# Patient Record
Sex: Male | Born: 1960
Health system: Southern US, Community
[De-identification: ages and names within clinical notes are randomized; demographics above are authoritative.]

## PROBLEM LIST (undated history)

## (undated) HISTORY — PX: FINGER SURGERY: SHX640

---

## 1998-05-10 ENCOUNTER — Emergency Department (HOSPITAL_COMMUNITY): Admission: EM | Admit: 1998-05-10 | Discharge: 1998-05-10 | Payer: Self-pay | Admitting: Internal Medicine

## 2008-04-23 ENCOUNTER — Emergency Department (HOSPITAL_COMMUNITY): Admission: EM | Admit: 2008-04-23 | Discharge: 2008-04-23 | Payer: Self-pay | Admitting: Emergency Medicine

## 2009-07-17 IMAGING — CR DG CERVICAL SPINE COMPLETE 4+V
6 series · 6 of 6 positions shown · non-contrast
Comparison: None

CLINICAL DATA: Trauma

CERVICAL SPINE - COMPLETE 4+ VIEW

[w c-spine lat *]
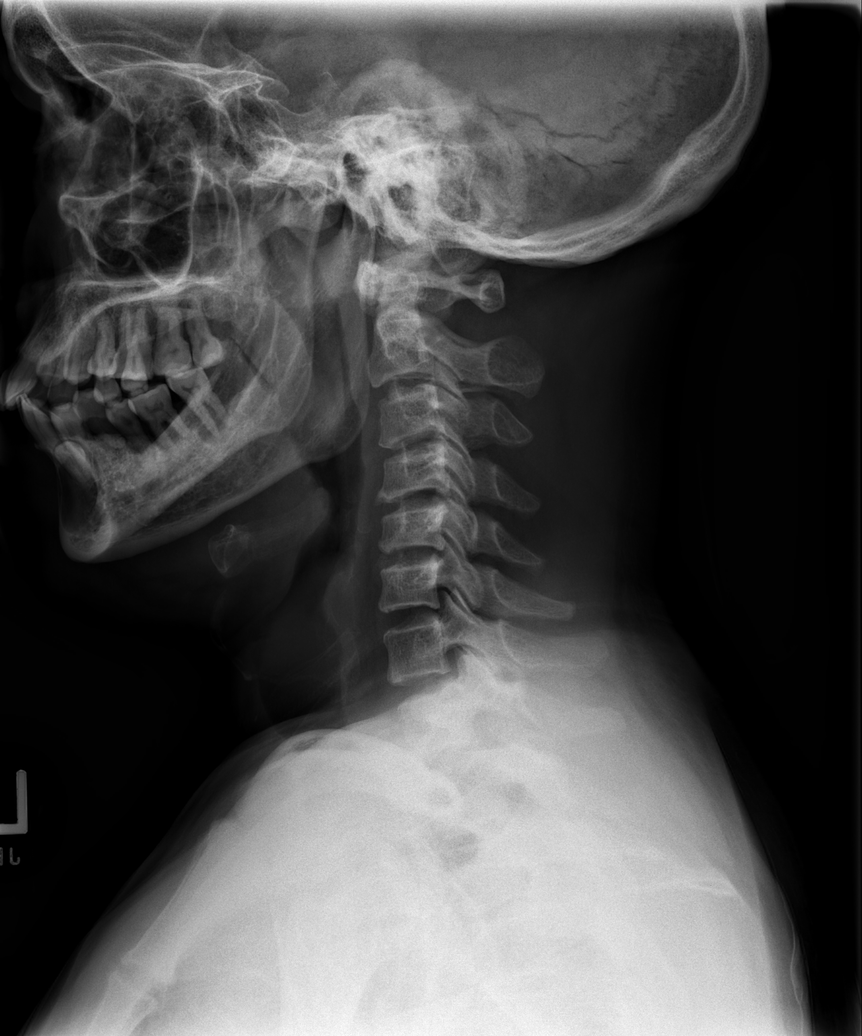

[w c-spine oblique]
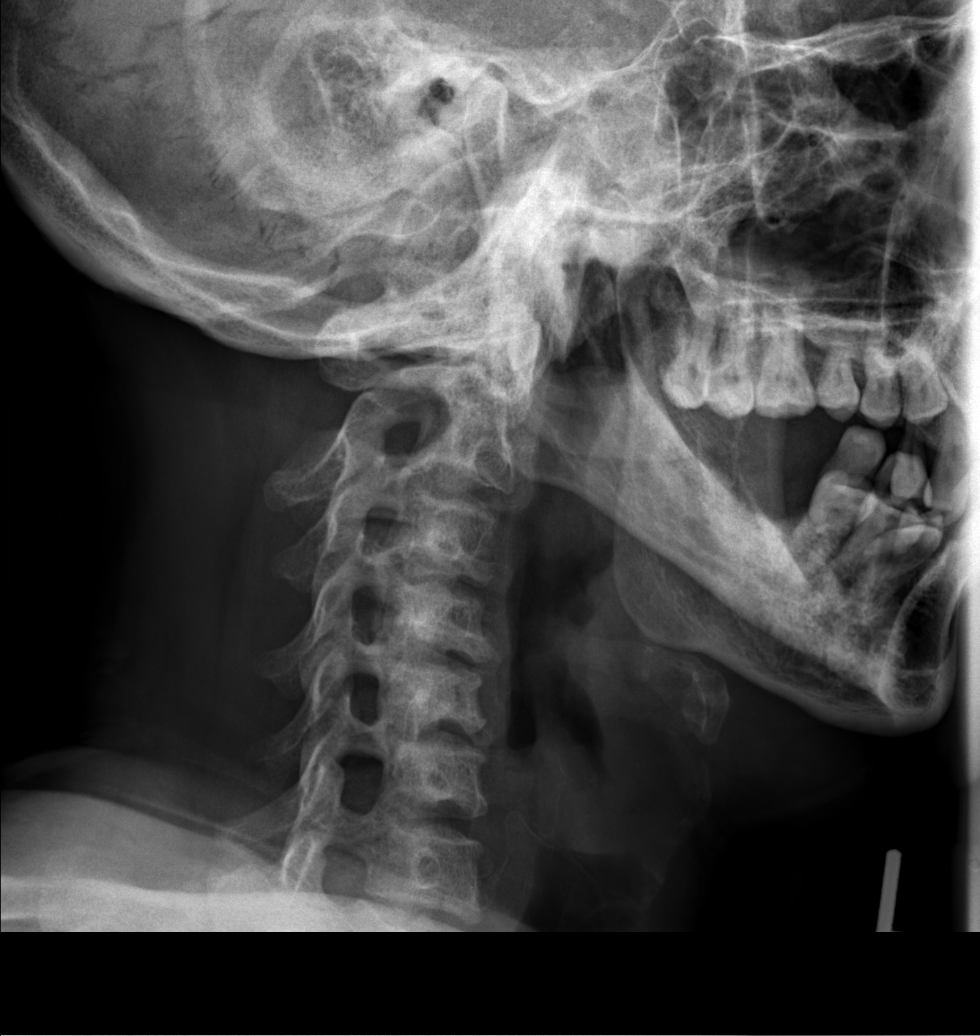

[w c-spine oblique *]
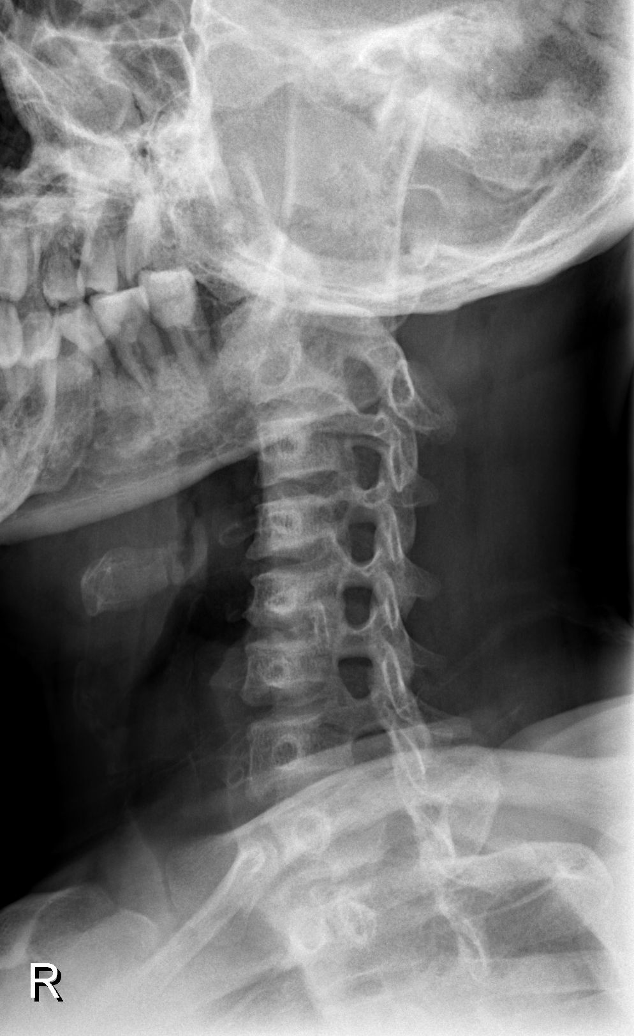

[w c-spine a.p.]
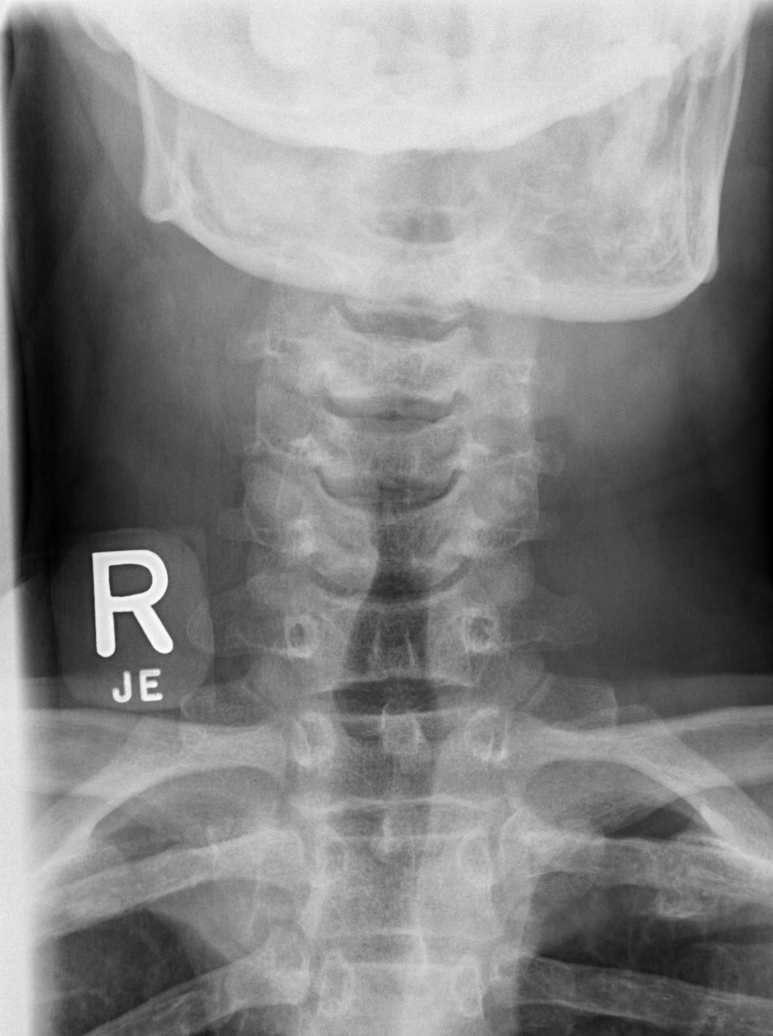

[w c-spine odontoid]
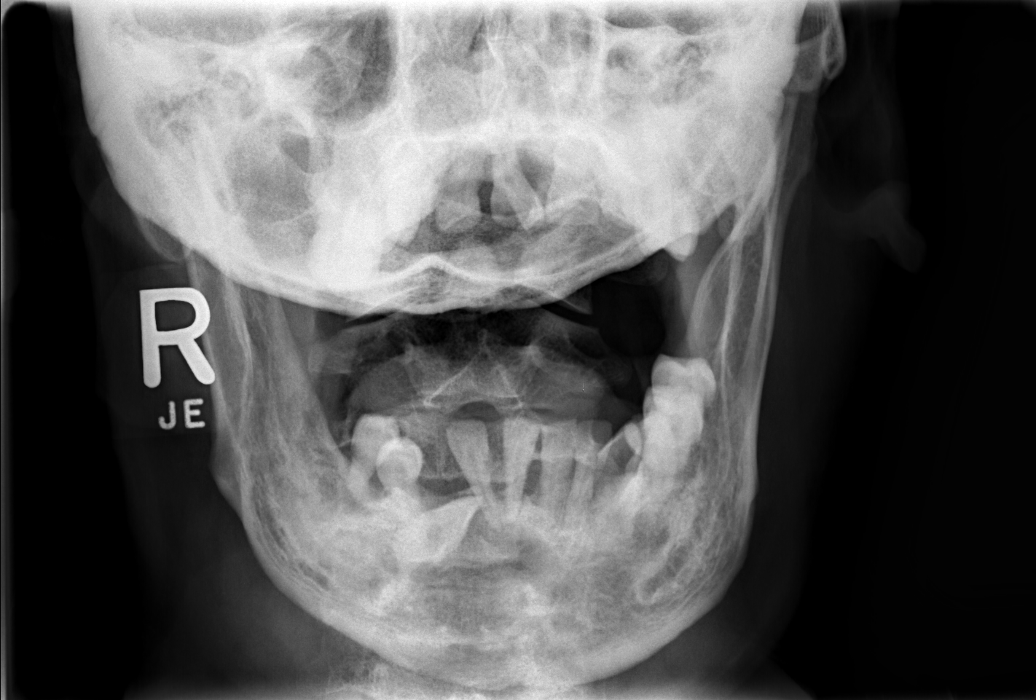

[w c-spine odontoid *]
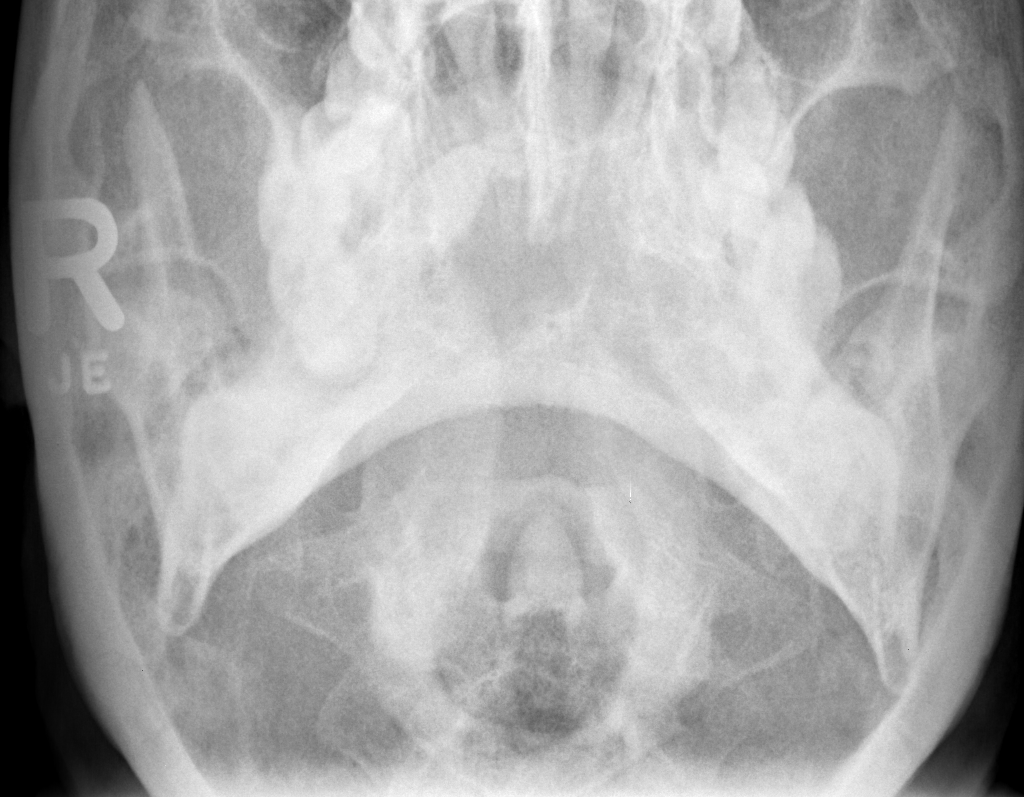

[6 of 6 positions shown; findings below may reference images not displayed]

FINDINGS: The lateral view images through the top of T1.
Prevertebral soft tissues are normal.  There is mild straightening
and reversal of the expected cervical lordosis centered about the
C5-C6 level.  Mild but age advanced spondylosis including at C4-C5
and C5-C6.  Apparent neural foraminal narrowing at C3-C4 on the
right.  Facets are well-aligned.  Odontoid process intact.  The
lateral masses partially obscured on the first open mouth view.
IMPRESSION: 1.  Minimally limited evaluation of the lateral masses.
2.  Otherwise no evidence of fracture subluxation.
3. Straightening of expected cervical lordosis could be positional,
due to muscular spasm, or ligamentous injury.
4.  Mildly age advanced spondylosis.

## 2009-09-15 ENCOUNTER — Emergency Department (HOSPITAL_COMMUNITY): Admission: EM | Admit: 2009-09-15 | Discharge: 2009-09-15 | Payer: Self-pay | Admitting: Family Medicine

## 2009-12-29 ENCOUNTER — Emergency Department (HOSPITAL_COMMUNITY): Admission: EM | Admit: 2009-12-29 | Discharge: 2009-12-29 | Payer: Self-pay | Admitting: Family Medicine

## 2011-01-26 LAB — CBC
HCT: 44.3 % (ref 39.0–52.0)
Hemoglobin: 14.7 g/dL (ref 13.0–17.0)
RBC: 5.51 MIL/uL (ref 4.22–5.81)
WBC: 8.3 10*3/uL (ref 4.0–10.5)

## 2011-01-26 LAB — DIFFERENTIAL
Eosinophils Relative: 5 % (ref 0–5)
Lymphocytes Relative: 28 % (ref 12–46)
Lymphs Abs: 2.3 10*3/uL (ref 0.7–4.0)
Monocytes Absolute: 0.7 10*3/uL (ref 0.1–1.0)
Monocytes Relative: 8 % (ref 3–12)

## 2012-11-14 ENCOUNTER — Encounter (HOSPITAL_COMMUNITY): Payer: Self-pay | Admitting: *Deleted

## 2012-11-14 ENCOUNTER — Emergency Department (HOSPITAL_COMMUNITY)
Admission: EM | Admit: 2012-11-14 | Discharge: 2012-11-14 | Disposition: A | Discharge status: Home / Self Care | Payer: BC Managed Care – PPO | Source: Home / Self Care | Attending: Emergency Medicine | Admitting: Emergency Medicine

## 2012-11-14 DIAGNOSIS — K1120 Sialoadenitis, unspecified: Secondary | ICD-10-CM

## 2012-11-14 DIAGNOSIS — K1122 Acute recurrent sialoadenitis: Secondary | ICD-10-CM

## 2012-11-14 MED ORDER — HYDROCODONE-ACETAMINOPHEN 5-325 MG PO TABS: 2.0000 | ORAL_TABLET | ORAL | Status: DC | PRN

## 2012-11-14 MED ORDER — PENICILLIN V POTASSIUM 500 MG PO TABS: 500.0000 mg | ORAL_TABLET | Freq: Four times a day (QID) | ORAL | Status: AC

## 2012-11-14 NOTE — ED Provider Notes (Addendum)
History     CSN: [id number]  Arrival date & time 11/14/12  1217   First MD Initiated Contact with Patient 11/14/12 1252      Chief Complaint  Patient presents with  . Facial Swelling    (Consider location/radiation/quality/duration/timing/severity/associated sxs/prior treatment) HPI Comments: Patient presents urgent care complaining of sudden onset of left facial swelling within the last 2 days. This morning was somewhat tender at touch and a bit more swollen.( Patient points to preauricular region/parotideal region). Patient goes into describing that he has had similar episodes before even on the right side of his face. In the past he has been given substance our to take or drink and has never taken antibiotics for it before. He feels this particular occasion the swelling is tender which is somewhat different than other previous dispense. He relates that in the 70s he had several of similar episodes. Patient denies any constitutional symptoms such as an additional weight loss, fevers, changes in appetite. Patient is a nonsmoker or does not chew tobacco. Patient denies a sore throat, difficulty swallowing, impaired hearing, or facial rashes. Denies any facial trauma or injury  Patient is a 52 y.o. male presenting with ear pain. The history is provided by the patient.  Otalgia This is a new problem. The current episode started 2 days ago. There is pain in the left ear. The problem has been gradually worsening. There has been no fever. The pain is at a severity of 5/10. The pain is mild. Pertinent negatives include no ear discharge, no headaches, no hearing loss, no rhinorrhea, no sore throat, no abdominal pain, no diarrhea, no neck pain and no rash. His past medical history does not include chronic ear infection or hearing loss.    History reviewed. No pertinent past medical history.  History reviewed. No pertinent past surgical history.  No family history on file.  History  Substance Use  Topics  . Smoking status: Never Smoker   . Smokeless tobacco: Not on file  . Alcohol Use: No      Review of Systems  Constitutional: Negative for fever, chills, diaphoresis, activity change and fatigue.  HENT: Positive for ear pain. Negative for hearing loss, sore throat, rhinorrhea, drooling, neck pain, neck stiffness, sinus pressure and ear discharge.   Eyes: Negative for discharge and itching.  Gastrointestinal: Negative for abdominal pain and diarrhea.  Skin: Negative for rash and wound.  Neurological: Positive for facial asymmetry. Negative for dizziness, tremors, seizures, weakness, light-headedness and headaches.    Allergies  Review of patient's allergies indicates no known allergies.  Home Medications   Current Outpatient Rx  Name  Route  Sig  Dispense  Refill  . HYDROCODONE-ACETAMINOPHEN 5-325 MG PO TABS   Oral   Take 2 tablets by mouth every 4 (four) hours as needed for pain.   10 tablet   0   . PENICILLIN V POTASSIUM 500 MG PO TABS   Oral   Take 1 tablet (500 mg total) by mouth 4 (four) times daily.   40 tablet   0     BP 124/80  Pulse 72  Temp 98.6 F (37 C) (Oral)  SpO2 100%  Physical Exam  Nursing note and vitals reviewed. Constitutional: He is oriented to person, place, and time. Vital signs are normal. He appears well-developed and well-nourished.  Non-toxic appearance. He does not have a sickly appearance. He does not appear ill. No distress.  HENT:  Head:    Right Ear: Hearing, tympanic membrane, external  ear and ear canal normal. No drainage or swelling.  Left Ear: Hearing, tympanic membrane, external ear and ear canal normal. No drainage or swelling.  Mouth/Throat: No oropharyngeal exudate.  Eyes: Conjunctivae normal are normal.  Neck: Neck supple. No JVD present. No tracheal deviation present. No thyromegaly present.  Cardiovascular: Normal rate.  Exam reveals no gallop and no friction rub.   No murmur heard. Pulmonary/Chest: Effort  normal and breath sounds normal.  Lymphadenopathy:    He has no cervical adenopathy.  Neurological: He is alert and oriented to person, place, and time. No cranial nerve deficit. He exhibits normal muscle tone. Coordination normal.  Skin: Skin is warm. No rash noted. No erythema.    ED Course  Procedures (including critical care time)  Labs Reviewed - No data to display No results found.   1. Acute recurrent sialoadenitis       MDM  Parotiditis versus sialoadenitis.-Anatomically affected area seems to be more consistent with his parotid gland. Have started patient on a course of penicillin given him something for pain and discomfort encouraged him to followup with an ENT doctor because of his recurrent episodes. And also discussed what symptoms should warrant his return, or to pursue further evaluation and treatment in the emergency department such as increased swelling tenderness or any fevers. Patient understood, and was agreeable to treatment plan and followup care as specified.        Jimmie Molly, MD 11/14/12 1354  Jimmie Molly, MD 11/14/12 1355

## 2012-11-14 NOTE — ED Notes (Signed)
Pt  Reports  Swelling  l  Side  Face  Near  The ear          The  Area  Is  Tender  To  Touch  He  denys  Any  sorethroat  Or  Any  Other  Symptoms         He  Reports  The  Symptoms  Started  yest  He  Reports  Had  Swelling  r side   Ear  In the  Distant  Past  But  It is  Not swollen    Now    He states

## 2014-02-28 ENCOUNTER — Emergency Department (HOSPITAL_COMMUNITY)
Admission: EM | Admit: 2014-02-28 | Discharge: 2014-02-28 | Disposition: A | Discharge status: Home / Self Care | Payer: BC Managed Care – PPO | Attending: Emergency Medicine | Admitting: Emergency Medicine

## 2014-02-28 ENCOUNTER — Encounter (HOSPITAL_COMMUNITY): Payer: Self-pay | Admitting: Emergency Medicine

## 2014-02-28 ENCOUNTER — Emergency Department (HOSPITAL_COMMUNITY): Payer: BC Managed Care – PPO

## 2014-02-28 ENCOUNTER — Emergency Department (INDEPENDENT_AMBULATORY_CARE_PROVIDER_SITE_OTHER)
Admission: EM | Admit: 2014-02-28 | Discharge: 2014-02-28 | Disposition: A | Discharge status: Nursing Home (Includes ICF) | Payer: BC Managed Care – PPO | Source: Home / Self Care | Attending: Family Medicine | Admitting: Family Medicine

## 2014-02-28 ENCOUNTER — Other Ambulatory Visit (HOSPITAL_COMMUNITY): Payer: BC Managed Care – PPO

## 2014-02-28 DIAGNOSIS — R1032 Left lower quadrant pain: Secondary | ICD-10-CM

## 2014-02-28 DIAGNOSIS — K529 Noninfective gastroenteritis and colitis, unspecified: Secondary | ICD-10-CM

## 2014-02-28 DIAGNOSIS — R52 Pain, unspecified: Secondary | ICD-10-CM

## 2014-02-28 DIAGNOSIS — K6389 Other specified diseases of intestine: Secondary | ICD-10-CM | POA: Insufficient documentation

## 2014-02-28 LAB — CBC WITH DIFFERENTIAL/PLATELET
BASOS ABS: 0 10*3/uL (ref 0.0–0.1)
BASOS PCT: 0 % (ref 0–1)
Eosinophils Absolute: 0.3 10*3/uL (ref 0.0–0.7)
Eosinophils Relative: 4 % (ref 0–5)
HEMATOCRIT: 46.1 % (ref 39.0–52.0)
HEMOGLOBIN: 15.4 g/dL (ref 13.0–17.0)
LYMPHS PCT: 28 % (ref 12–46)
Lymphs Abs: 2.5 10*3/uL (ref 0.7–4.0)
MCH: 26.5 pg (ref 26.0–34.0)
MCHC: 33.4 g/dL (ref 30.0–36.0)
MCV: 79.3 fL (ref 78.0–100.0)
MONO ABS: 0.6 10*3/uL (ref 0.1–1.0)
MONOS PCT: 6 % (ref 3–12)
NEUTROS ABS: 5.6 10*3/uL (ref 1.7–7.7)
NEUTROS PCT: 62 % (ref 43–77)
Platelets: 182 10*3/uL (ref 150–400)
RBC: 5.81 MIL/uL (ref 4.22–5.81)
RDW: 13 % (ref 11.5–15.5)
WBC: 9 10*3/uL (ref 4.0–10.5)

## 2014-02-28 LAB — URINALYSIS, ROUTINE W REFLEX MICROSCOPIC
Bilirubin Urine: NEGATIVE
GLUCOSE, UA: NEGATIVE mg/dL
Hgb urine dipstick: NEGATIVE
Ketones, ur: NEGATIVE mg/dL
LEUKOCYTES UA: NEGATIVE
NITRITE: NEGATIVE
PH: 7 (ref 5.0–8.0)
Protein, ur: NEGATIVE mg/dL
SPECIFIC GRAVITY, URINE: 1.038 — AB (ref 1.005–1.030)
Urobilinogen, UA: 0.2 mg/dL (ref 0.0–1.0)

## 2014-02-28 LAB — BASIC METABOLIC PANEL
BUN: 11 mg/dL (ref 6–23)
CHLORIDE: 102 meq/L (ref 96–112)
CO2: 24 meq/L (ref 19–32)
CREATININE: 0.8 mg/dL (ref 0.50–1.35)
Calcium: 9.3 mg/dL (ref 8.4–10.5)
GFR calc non Af Amer: >90 mL/min (ref >90)
Glucose, Bld: 101 mg/dL — ABNORMAL HIGH (ref 70–99)
POTASSIUM: 3.9 meq/L (ref 3.7–5.3)
Sodium: 140 mEq/L (ref 137–147)

## 2014-02-28 LAB — POCT URINALYSIS DIP (DEVICE)
BILIRUBIN URINE: NEGATIVE
Glucose, UA: NEGATIVE mg/dL
Hgb urine dipstick: NEGATIVE
KETONES UR: NEGATIVE mg/dL
LEUKOCYTES UA: NEGATIVE
NITRITE: NEGATIVE
Protein, ur: NEGATIVE mg/dL
Specific Gravity, Urine: 1.02 (ref 1.005–1.030)
Urobilinogen, UA: 0.2 mg/dL (ref 0.0–1.0)
pH: 7 (ref 5.0–8.0)

## 2014-02-28 MED ORDER — IOHEXOL 300 MG/ML  SOLN
25.0000 mL | INTRAMUSCULAR | Status: AC | PRN
  Administered 2014-02-28 (×2): 25 mL via ORAL

## 2014-02-28 MED ORDER — OXYCODONE-ACETAMINOPHEN 5-325 MG PO TABS
1.0000 | ORAL_TABLET | Freq: Four times a day (QID) | ORAL | Status: DC | PRN
Start: 2014-02-28 — End: 2020-03-09

## 2014-02-28 MED ORDER — MORPHINE SULFATE 4 MG/ML IJ SOLN
4.0000 mg | Freq: Once | INTRAMUSCULAR | Status: AC
  Administered 2014-02-28: 4 mg via INTRAVENOUS
  Filled 2014-02-28: qty 1

## 2014-02-28 MED ORDER — SODIUM CHLORIDE 0.9 % IV BOLUS (SEPSIS)
1000.0000 mL | Freq: Once | INTRAVENOUS | Status: AC
Start: 2014-02-28 — End: 2014-02-28
  Administered 2014-02-28: 1000 mL via INTRAVENOUS

## 2014-02-28 MED ORDER — ONDANSETRON HCL 4 MG PO TABS: 4.0000 mg | ORAL_TABLET | Freq: Four times a day (QID) | ORAL | Status: DC

## 2014-02-28 MED ORDER — IOHEXOL 300 MG/ML  SOLN
100.0000 mL | Freq: Once | INTRAMUSCULAR | Status: AC | PRN
  Administered 2014-02-28: 100 mL via INTRAVENOUS

## 2014-02-28 NOTE — ED Notes (Signed)
Pt unable to void at this time urinal at bedside 

## 2014-02-28 NOTE — ED Provider Notes (Signed)
CSN: [id number]     Arrival date & time 02/28/14  1233 History   None    Chief Complaint  Patient presents with  . Abdominal Pain    LLQ   (Consider location/radiation/quality/duration/timing/severity/associated sxs/prior Treatment) Patient is a 53 y.o. male presenting with abdominal pain. The history is provided by the patient.  Abdominal Pain Pain location:  LLQ Pain quality: sharp and shooting   Pain radiates to:  Does not radiate Pain severity:  Moderate Onset quality:  Sudden Duration:  14 hours Timing:  Constant Progression:  Worsening Chronicity:  Recurrent Relieved by:  None tried Worsened by:  Palpation Ineffective treatments:  None tried Associated symptoms: nausea   Associated symptoms: no diarrhea, no dysuria, no fever, no hematemesis, no hematochezia, no melena and no vomiting     History reviewed. No pertinent past medical history. History reviewed. No pertinent past surgical history. No family history on file. History  Substance Use Topics  . Smoking status: Never Smoker   . Smokeless tobacco: Not on file  . Alcohol Use: No    Review of Systems  Constitutional: Negative.  Negative for fever.  Gastrointestinal: Positive for nausea and abdominal pain. Negative for vomiting, diarrhea, blood in stool, melena, hematochezia and hematemesis.  Genitourinary: Negative for dysuria.  Skin: Negative.     Allergies  Review of patient's allergies indicates no known allergies.  Home Medications   Prior to Admission medications   Medication Sig Start Date End Date Taking? Authorizing Provider  HYDROcodone-acetaminophen (NORCO/VICODIN) 5-325 MG per tablet Take 2 tablets by mouth every 4 (four) hours as needed for pain. 11/14/12   Jimmie MollyPaolo Coll, MD   BP 121/80  Pulse 68  Temp(Src) 97.1 F (36.2 C) (Oral)  Resp 18  SpO2 95% Physical Exam  Nursing note and vitals reviewed. Constitutional: He is oriented to person, place, and time. He appears well-developed and  well-nourished. He appears distressed.  Abdominal: Soft. Bowel sounds are normal. He exhibits no distension and no mass. There is no hepatosplenomegaly. There is tenderness in the left lower quadrant. There is guarding. There is no rigidity, no rebound, no CVA tenderness and negative Murphy's sign. No hernia.  Neurological: He is alert and oriented to person, place, and time.  Skin: Skin is warm and dry.    ED Course  Procedures (including critical care time) Labs Review Labs Reviewed  POCT URINALYSIS DIP (DEVICE)    Imaging Review No results found.   MDM   1. Abdominal pain, acute, left lower quadrant    Sent for ct eval of llq pain ,r/o divertic.    Linna HoffJames D Kindl, MD 02/28/14 1539

## 2014-02-28 NOTE — ED Provider Notes (Signed)
Medical screening examination/treatment/procedure(s) were performed by non-physician practitioner and as supervising physician I was immediately available for consultation/collaboration.   EKG Interpretation None        Andrew PoundMichael Y. Oletta LamasGhim, MD 02/28/14 [id number]

## 2014-02-28 NOTE — ED Provider Notes (Signed)
CSN: [id number]     Arrival date & time 02/28/14  1551 History   First MD Initiated Contact with Patient 02/28/14 1612     Chief Complaint  Patient presents with  . Abdominal Pain     (Consider location/radiation/quality/duration/timing/severity/associated sxs/prior Treatment) HPI  Patient to the ER sent in by the Urgent Care with complaints of LLQ pain that started two months ago and then resolved. However, it became significantly worse yesterday evening. Now it hurts when he breaths, moves, coughs. He describes it as sharp and shooting. He has not had any dysuria, fever, diarrhea, blood in vomit of bowels. NO testicular swelling or penile discharge.  History reviewed. No pertinent past medical history. History reviewed. No pertinent past surgical history. History reviewed. No pertinent family history. History  Substance Use Topics  . Smoking status: Never Smoker   . Smokeless tobacco: Not on file  . Alcohol Use: No    Review of Systems  Constitutional: Negative. Negative for fever.  Gastrointestinal: Positive for nausea and abdominal pain. Negative for vomiting, diarrhea, blood in stool, melena, hematochezia and hematemesis.  Genitourinary: Negative for dysuria.  Skin: Negative.    Allergies  Review of patient's allergies indicates no known allergies.  Home Medications   Prior to Admission medications   Medication Sig Start Date End Date Taking? Authorizing Provider  HYDROcodone-acetaminophen (NORCO/VICODIN) 5-325 MG per tablet Take 2 tablets by mouth every 4 (four) hours as needed for pain. 11/14/12   Jimmie MollyPaolo Coll, MD   BP 125/80  Pulse 72  Temp(Src) 97.1 F (36.2 C) (Oral)  Resp 18  Ht 5' (1.524 m)  Wt 138 lb (62.596 kg)  BMI 26.95 kg/m2  SpO2 100% Physical Exam  Nursing note and vitals reviewed. Constitutional: He appears well-developed and well-nourished. No distress.  HENT:  Head: Normocephalic and atraumatic.  Eyes: Pupils are equal, round, and reactive to  light.  Neck: Normal range of motion. Neck supple.  Cardiovascular: Normal rate and regular rhythm.   Pulmonary/Chest: Effort normal.  Abdominal: Soft. Bowel sounds are normal. There is tenderness in the left lower quadrant. There is guarding. There is no rigidity and no rebound.  Neurological: He is alert.  Skin: Skin is warm and dry.    ED Course  Procedures (including critical care time) Labs Review Labs Reviewed  BASIC METABOLIC PANEL - Abnormal; Notable for the following:    Glucose, Bld 101 (*)    All other components within normal limits  URINALYSIS, ROUTINE W REFLEX MICROSCOPIC - Abnormal; Notable for the following:    Specific Gravity, Urine 1.038 (*)    All other components within normal limits  CBC WITH DIFFERENTIAL    Imaging Review Ct Abdomen Pelvis W Contrast  02/28/2014   CLINICAL DATA:  Left lower cough quadrant pain.  Intermittent pain.  EXAM: CT ABDOMEN AND PELVIS WITH CONTRAST  TECHNIQUE: Multidetector CT imaging of the abdomen and pelvis was performed using the standard protocol following bolus administration of intravenous contrast.  CONTRAST:  100mL OMNIPAQUE IOHEXOL 300 MG/ML  SOLN  COMPARISON:  None.  FINDINGS: Lung bases are clear.  No pericardial fluid.  No focal hepatic lesion. The gallbladder, pancreas, spleen, adrenal glands, and kidneys are normal.  The stomach, small bowel, appendix, and cecum are normal. There is a focus of pericolonic inflammation at the junction of the descending colon and sigmoid colon. On the coronal images, this pericolonic inflammation surrounds a fatty lesion measuring 2.3 x 1.5 cm (image 43, series 203, coronal). These findings are  consistent with an inflamed epiploic appendage. The sigmoid colon and rectum are normal.  Abdominal or is normal caliber. No retroperitoneal periportal lymphadenopathy.  No free fluid the pelvis. Prostate gland is normal. There is a 7 mm to 9 mm left external iliac lymph node which is not pathologic by size  criterion. No aggressive osseous lesion.  IMPRESSION: 1. Epiploic appendagitis of the distal descending colon. This is a self-limiting process which would explain patient's left lower quadrant pain. 2. Single borderline enlarged left external iliac lymph node is likely reactive.   Electronically Signed   By: Genevive BiStewart  Edmunds M.D.   On: 02/28/2014 17:40     EKG Interpretation None      MDM   Final diagnoses:  Epiploic appendagitis    The patient is afebrile and is able to ambulate without difficulty. I discussed CT findings with Dr. Oletta LamasGhim and in this situation it is symptomatic treatment. Will give referral to surgery who he can follow-up with as an outpatient if he wants. Pain managed in the ED, abdomen re-evaluated and continues to be soft.  Rx: percocet and Zofran.  53 y.o.Gerardo Birkel's evaluation in the Emergency Department is complete. It has been determined that no acute conditions requiring further emergency intervention are present at this time. The patient/guardian have been advised of the diagnosis and plan. We have discussed signs and symptoms that warrant return to the ED, such as changes or worsening in symptoms.  Vital signs are stable at discharge. Filed Vitals:   02/28/14 1630  BP: 125/80  Pulse: 72  Temp:   Resp:     Patient/guardian has voiced understanding and agreed to follow-up with the PCP or specialist.      Dorthula Matasiffany G Marlin Jarrard, PA-C 02/28/14 1758

## 2014-02-28 NOTE — ED Notes (Signed)
Pt c/o LLQ pain onset yest Reports it has been intermittent x2 months Yest pain increased; increases w/pressure, deep breaths, and activity Denies f/v/n/d, urinary sx... Normal bowel movements Alert w/no signs of acute distress.

## 2014-02-28 NOTE — Discharge Instructions (Signed)
Abdominal Pain, Adult °Many things can cause abdominal pain. Usually, abdominal pain is not caused by a disease and will improve without treatment. It can often be observed and treated at home. Your health care provider will do a physical exam and possibly order blood tests and X-rays to help determine the seriousness of your pain. However, in many cases, more time must pass before a clear cause of the pain can be found. Before that point, your health care provider may not know if you need more testing or further treatment. °HOME CARE INSTRUCTIONS  °Monitor your abdominal pain for any changes. The following actions may help to alleviate any discomfort you are experiencing: °· Only take over-the-counter or prescription medicines as directed by your health care provider. °· Do not take laxatives unless directed to do so by your health care provider. °· Try a clear liquid diet (broth, tea, or water) as directed by your health care provider. Slowly move to a bland diet as tolerated. °SEEK MEDICAL CARE IF: °· You have unexplained abdominal pain. °· You have abdominal pain associated with nausea or diarrhea. °· You have pain when you urinate or have a bowel movement. °· You experience abdominal pain that wakes you in the night. °· You have abdominal pain that is worsened or improved by eating food. °· You have abdominal pain that is worsened with eating fatty foods. °SEEK IMMEDIATE MEDICAL CARE IF:  °· Your pain does not go away within 2 hours. °· You have a fever. °· You keep throwing up (vomiting). °· Your pain is felt only in portions of the abdomen, such as the right side or the left lower portion of the abdomen. °· You pass bloody or black tarry stools. °MAKE SURE YOU: °· Understand these instructions.   °· Will watch your condition.   °· Will get help right away if you are not doing well or get worse.   °Document Released: 08/02/2005 Document Revised: 08/13/2013 Document Reviewed: 07/02/2013 °[employer]® Patient  Information ©2014 [employer]. ° °

## 2014-02-28 NOTE — ED Notes (Signed)
Reports LLQ pain intermittent x 2 months, became more severe last night. Denies vomiting or diarrhea.

## 2020-03-09 ENCOUNTER — Encounter (HOSPITAL_COMMUNITY): Payer: Self-pay

## 2020-03-09 ENCOUNTER — Ambulatory Visit (HOSPITAL_COMMUNITY)
Admission: EM | Admit: 2020-03-09 | Discharge: 2020-03-09 | Disposition: A | Discharge status: Home / Self Care | Payer: BC Managed Care – PPO | Attending: Family | Admitting: Family

## 2020-03-09 DIAGNOSIS — K05219 Aggressive periodontitis, localized, unspecified severity: Secondary | ICD-10-CM

## 2020-03-09 DIAGNOSIS — L03211 Cellulitis of face: Secondary | ICD-10-CM | POA: Diagnosis not present

## 2020-03-09 DIAGNOSIS — R6884 Jaw pain: Secondary | ICD-10-CM | POA: Diagnosis not present

## 2020-03-09 MED ORDER — SULFAMETHOXAZOLE-TRIMETHOPRIM 800-160 MG PO TABS: 1.0000 | ORAL_TABLET | Freq: Two times a day (BID) | ORAL | 0 refills | Status: AC

## 2020-03-09 MED ORDER — PREDNISONE 10 MG (21) PO TBPK: ORAL_TABLET | Freq: Every day | ORAL | 0 refills | Status: AC

## 2020-03-09 MED ORDER — TRAMADOL HCL 50 MG PO TABS: 50.0000 mg | ORAL_TABLET | Freq: Four times a day (QID) | ORAL | 0 refills | Status: AC | PRN

## 2020-03-09 NOTE — ED Provider Notes (Signed)
[facility]    CSN: [id number] Arrival date & time: 03/09/20  1007      History   Chief Complaint Chief Complaint  Patient presents with  . Jaw Pain    HPI Andrew Terry is a 59 y.o. male.   59 year old male presents with lower jaw pain, swelling, and redness that started 3 days ago. He received his first COVID 19 vaccine on Saturday (3 days ago) and felt fine at the time of administration. He experienced some soreness in his right arm but no other immediate symptoms. A few hours later in the evening, he started experiencing a low grade fever, ringing in his ears, headache and some soreness and swelling of his chin. 2 days ago he then started having more jaw pain, swelling and firmness of his chin. Yesterday he noticed a small white "spot" along his lower gum line near his bottom front teeth which was very sore. The pain and swelling has continued to get worse along with the headache and intermittent low grade fevers. He has tried applying ice to the area and took Ibuprofen 600mg  and Tylenol with no relief. He does have few teeth which are in poor repair but no known injury to his teeth or recent issues. Otherwise, no other chronic health issues. Takes no daily medication.   The history is provided by the patient.    History reviewed. No pertinent past medical history.  There are no problems to display for this patient.   Past Surgical History:  Procedure Laterality Date  . FINGER SURGERY Right        Home Medications    Prior to Admission medications   Medication Sig Start Date End Date Taking? Authorizing Provider  predniSONE (STERAPRED UNI-PAK 21 TAB) 10 MG (21) TBPK tablet Take by mouth daily. Take 6 tabs by mouth today then decrease by 1 tablet each day until finished on day 6 03/09/20   05/09/20, NP  sulfamethoxazole-trimethoprim (BACTRIM DS) 800-160 MG tablet Take 1 tablet by mouth 2 (two) times daily for 10 days. 03/09/20 03/19/20  03/21/20, NP    traMADol (ULTRAM) 50 MG tablet Take 1 tablet (50 mg total) by mouth every 6 (six) hours as needed for up to 5 days for severe pain. 03/09/20 03/14/20  05/14/20, NP    Family History No family history on file.  Social History Social History   Tobacco Use  . Smoking status: Former Smoker  Substance Use Topics  . Alcohol use: No  . Drug use: Never     Allergies   Patient has no known allergies.   Review of Systems Review of Systems  Constitutional: Positive for activity change, fatigue and fever. Negative for appetite change, chills and diaphoresis.  HENT: Positive for dental problem, facial swelling, mouth sores and tinnitus. Negative for congestion, ear discharge, ear pain, nosebleeds, postnasal drip, rhinorrhea, sinus pressure, sinus pain, sore throat and trouble swallowing.   Eyes: Negative for photophobia, pain, discharge and visual disturbance.  Respiratory: Negative for cough, chest tightness, shortness of breath and wheezing.   Gastrointestinal: Negative for nausea and vomiting.  Musculoskeletal: Positive for arthralgias (jaw pain). Negative for neck pain and neck stiffness.  Skin: Positive for color change. Negative for rash.  Allergic/Immunologic: Negative for environmental allergies, food allergies and immunocompromised state.  Neurological: Positive for headaches. Negative for dizziness, tremors, seizures, syncope, weakness, light-headedness and numbness.  Hematological: Negative for adenopathy. Does not bruise/bleed easily.  Physical Exam Triage Vital Signs ED Triage Vitals  Enc Vitals Group     BP 03/09/20 1045 (!) 145/94     Pulse Rate 03/09/20 1045 (!) 104     Resp 03/09/20 1045 18     Temp 03/09/20 1045 98.8 F (37.1 C)     Temp Source 03/09/20 1045 Oral     SpO2 03/09/20 1045 98 %     Weight 03/09/20 1046 135 lb (61.2 kg)     Height 03/09/20 1046 5\' 2"  (1.575 m)     Head Circumference --      Peak Flow --      Pain Score 03/09/20 1046 10      Pain Loc --      Pain Edu? --      Excl. in GC? --    No data found.  Updated Vital Signs BP (!) 145/94   Pulse (!) 104   Temp 98.8 F (37.1 C) (Oral)   Resp 18   Ht 5\' 2"  (1.575 m)   Wt 135 lb (61.2 kg)   SpO2 98%   BMI 24.69 kg/m   Visual Acuity Right Eye Distance:   Left Eye Distance:   Bilateral Distance:    Right Eye Near:   Left Eye Near:    Bilateral Near:     Physical Exam Vitals and nursing note reviewed.  Constitutional:      General: He is awake. He is not in acute distress.    Appearance: He is well-developed and well-groomed. He is ill-appearing.     Comments: He is sitting comfortably on the exam table in no acute distress but appears ill and in pain.   HENT:     Head: Normocephalic.     Jaw: There is normal jaw occlusion. Tenderness and swelling present. No trismus or malocclusion.      Comments: Erythema, swelling and firmness present at central lower jaw/chin area, ending near corners of mouth. Very tender and firm. Slight bilateral submandibular lymphadenopathy present. No distinct enlargement of parotid, sublingual or submandibular salivary glands.     Right Ear: Hearing, tympanic membrane, ear canal and external ear normal.     Left Ear: Hearing, tympanic membrane, ear canal and external ear normal.     Nose: Nose normal.     Right Sinus: No maxillary sinus tenderness or frontal sinus tenderness.     Left Sinus: No maxillary sinus tenderness or frontal sinus tenderness.     Mouth/Throat:     Lips: Pink.     Mouth: Mucous membranes are moist.     Dentition: Abnormal dentition. Gingival swelling, dental caries and gum lesions present.     Tongue: No lesions. Tongue does not deviate from midline.     Palate: No mass and lesions.     Pharynx: Oropharynx is clear. Uvula midline. No pharyngeal swelling, oropharyngeal exudate, posterior oropharyngeal erythema or uvula swelling.      Comments: White hard raised lesion present midline to slight right side  at lower gumline, close to frenulum. Very red, swollen and tender. Unable to express any fluid from area. Multiple teeth missing and front lower teeth with gingival irritation. No definite dental abscess present.  Eyes:     Extraocular Movements: Extraocular movements intact.     Conjunctiva/sclera: Conjunctivae normal.  Neck:     Comments: No distinct swelling, tenderness or redness of neck or cervical lymph nodes.  Cardiovascular:     Rate and Rhythm: Regular rhythm. Tachycardia present.  Heart sounds: Normal heart sounds. No murmur.  Pulmonary:     Effort: Pulmonary effort is normal. No respiratory distress.     Breath sounds: Normal breath sounds and air entry. No decreased breath sounds, wheezing, rhonchi or rales.  Musculoskeletal:     Cervical back: Normal range of motion and neck supple. No edema, erythema, rigidity or tenderness. No pain with movement or muscular tenderness. Normal range of motion.  Lymphadenopathy:     Cervical: No cervical adenopathy.  Skin:    General: Skin is warm.     Capillary Refill: Capillary refill takes less than 2 seconds.     Findings: Erythema (lower central chin) present. No bruising, ecchymosis or rash.  Neurological:     General: No focal deficit present.     Mental Status: He is alert and oriented to person, place, and time.     Cranial Nerves: Cranial nerves are intact.     Sensory: Sensation is intact.  Psychiatric:        Mood and Affect: Mood normal.        Behavior: Behavior normal. Behavior is cooperative.        Thought Content: Thought content normal.        Judgment: Judgment normal.          UC Treatments / Results  Labs (all labs ordered are listed, but only abnormal results are displayed) Labs Reviewed - No data to display  EKG   Radiology No results found.  Procedures Procedures (including critical care time)  Medications Ordered in UC Medications - No data to display  Initial Impression / Assessment and  Plan / UC Course  I have reviewed the triage vital signs and the nursing notes.  Pertinent labs & imaging results that were available during my care of the patient were reviewed by me and considered in my medical decision making (see chart for details).    Reviewed with patient that he has cellulitis from an abscess in his lower gum. Uncertain definite cause but doubt lesion and cellulitis from COVID 19 vaccine. Low grade fevers may be result of vaccine but also part of cellulitis. No definite larger salivary gland abnormality but possible obstruction of smaller salivary glands with resulting abscess. Unable to express any discharge to send for culture. Will start on Bactrim DS twice a day as directed to cover probable Strep or Staph infection and possible MRSA. Recommend Prednisone 10mg  6 day dose pack as directed to help with swelling and inflammation. Recommend apply warm moist compresses to area and alternate with ice for comfort. For severe pain, may take Tramadol 50mg  every 6 to 8 hours as needed. At this time, encourage patient to receive 2nd dose of COVID 19 vaccine as scheduled in 1 month since doubt this is a reaction to the vaccine. Follow-up with your PCP or here in 2 to 3 days if not improving or go to the ER if symptoms worsen. Also encouraged to make an appointment with his dentist for routine care ASAP.  Final Clinical Impressions(s) / UC Diagnoses   Final diagnoses:  Cellulitis of chin  Gum abscess  Pain in lower jaw     Discharge Instructions     Recommend start Bactrim (antibiotic) twice a day as directed for infection. Start Prednisone (to help with swelling and pain)- 10mg  tablets- take 6 tablets today then 5 tablet on day 2, then 4 tablets on day 3, then 3 tablets on day 4 then 2 tablets on day 5 and  1 tablet on day 6 to finish medication. May continue to apply ice to area and alternate with moist heat for comfort. May also take Tramadol 50mg  every 6 to 8 hours as needed for  severe pain. Follow-up with your PCP or here in 2 to 3 days if not improving or go to the ER if symptoms worsen. Also make an appointment with a dentist for routine care ASAP.     ED Prescriptions    Medication Sig Dispense Auth. Provider   sulfamethoxazole-trimethoprim (BACTRIM DS) 800-160 MG tablet Take 1 tablet by mouth 2 (two) times daily for 10 days. 20 tablet Katy Apo, NP   predniSONE (STERAPRED UNI-PAK 21 TAB) 10 MG (21) TBPK tablet Take by mouth daily. Take 6 tabs by mouth today then decrease by 1 tablet each day until finished on day 6 21 tablet Courtnay Petrilla, Nicholes Stairs, NP   traMADol (ULTRAM) 50 MG tablet Take 1 tablet (50 mg total) by mouth every 6 (six) hours as needed for up to 5 days for severe pain. 15 tablet Norena Bratton, Nicholes Stairs, NP     I have reviewed the PDMP during this encounter. No active RX listed. I feel the benefits outweigh the risks for a controlled medication at this time.    Katy Apo, NP 03/10/20 1127

## 2020-03-09 NOTE — ED Triage Notes (Signed)
Pt c/o 10/10 pain in jaw started Saturday after COVID vaccine, pt also c/o fever and ringing in ears bilat since COVID vaccine. Pt has 2+ edema of chin and part of face and chin is erythematous. Pt denies dysphagia and SOB.

## 2020-03-09 NOTE — Discharge Instructions (Addendum)
Recommend start Bactrim (antibiotic) twice a day as directed for infection. Start Prednisone (to help with swelling and pain)- 10mg  tablets- take 6 tablets today then 5 tablet on day 2, then 4 tablets on day 3, then 3 tablets on day 4 then 2 tablets on day 5 and 1 tablet on day 6 to finish medication. May continue to apply ice to area and alternate with moist heat for comfort. May also take Tramadol 50mg  every 6 to 8 hours as needed for severe pain. Follow-up with your PCP or here in 2 to 3 days if not improving or go to the ER if symptoms worsen. Also make an appointment with a dentist for routine care ASAP.

## 2020-03-27 ENCOUNTER — Encounter (HOSPITAL_COMMUNITY): Payer: Self-pay | Admitting: Gynecology

## 2020-03-27 ENCOUNTER — Other Ambulatory Visit: Payer: Self-pay

## 2020-03-27 ENCOUNTER — Ambulatory Visit (HOSPITAL_COMMUNITY)
Admission: EM | Admit: 2020-03-27 | Discharge: 2020-03-27 | Disposition: A | Discharge status: Home / Self Care | Payer: BC Managed Care – PPO | Attending: Physician Assistant | Admitting: Physician Assistant

## 2020-03-27 DIAGNOSIS — K122 Cellulitis and abscess of mouth: Secondary | ICD-10-CM | POA: Diagnosis not present

## 2020-03-27 MED ORDER — AMOXICILLIN-POT CLAVULANATE 875-125 MG PO TABS: 1.0000 | ORAL_TABLET | Freq: Two times a day (BID) | ORAL | 0 refills | Status: AC

## 2020-03-27 NOTE — ED Triage Notes (Signed)
Patient c/o tooth infection / redness at chin x 2 weeks. Patient stated was seen on 03/09/20 . Per patient symptoms not better.Patient did take his antibiotic Rx. Sulfamethazine/trimethropram. Per patient thought it would make him sleepy. Patient was also given Rx tramadol which he did not take.

## 2020-03-27 NOTE — ED Provider Notes (Addendum)
[facility]    CSN: [id number] Arrival date & time: 03/27/20  1435      History   Chief Complaint Chief Complaint  Patient presents with  . Dental Pain    HPI Andrew Terry is a 59 y.o. male.   Patient reports urgent care for concern of gingival swelling, pain and lower lip swelling.  He was seen on 03/09/2020 for similar diagnosed with cellulitis of his chin and concern for abscess.  At this time he is also concerned about a reaction to the Covid vaccine that he received on 03/06/2020.  He was prescribed Bactrim for infection, tramadol for pain and prednisone for concern of allergic reaction.  He reports he completed the prednisone.  However he did reports he was worried that the Bactrim make him sleepy so he took this on days of his not at work.  So he has not completed this.  He reports since that time he had some discharge from the areas of swelling with blood and purulent fluid.  Since then the swelling is gone down some.  Continues endorse some dental pain and mild swelling in the inside part of his gum.  Denies fever or chills.  He did not follow-up with a dentist about the concern.  Denies any difficulty swallowing.  No pain into his neck.     History reviewed. No pertinent past medical history.  There are no problems to display for this patient.   Past Surgical History:  Procedure Laterality Date  . FINGER SURGERY Right        Home Medications    Prior to Admission medications   Medication Sig Start Date End Date Taking? Authorizing Provider  amoxicillin-clavulanate (AUGMENTIN) 875-125 MG tablet Take 1 tablet by mouth 2 (two) times daily for 10 days. 03/27/20 04/06/20  Melissa Pulido, Veryl Speak, PA-C  predniSONE (STERAPRED UNI-PAK 21 TAB) 10 MG (21) TBPK tablet Take by mouth daily. Take 6 tabs by mouth today then decrease by 1 tablet each day until finished on day 6 03/09/20   Sudie Grumbling, NP    Family History History reviewed. No pertinent family history.  Social  History Social History   Tobacco Use  . Smoking status: Former Games developer  . Smokeless tobacco: Never Used  Substance Use Topics  . Alcohol use: No  . Drug use: Never     Allergies   Patient has no known allergies.   Review of Systems Review of Systems   Physical Exam Triage Vital Signs ED Triage Vitals  Enc Vitals Group     BP 03/27/20 1450 137/77     Pulse Rate 03/27/20 1450 97     Resp 03/27/20 1450 16     Temp 03/27/20 1450 98.5 F (36.9 C)     Temp Source 03/27/20 1450 Oral     SpO2 03/27/20 1450 98 %     Weight 03/27/20 1453 135 lb (61.2 kg)     Height 03/27/20 1453 5\' 2"  (1.575 m)     Head Circumference --      Peak Flow --      Pain Score 03/27/20 1453 5     Pain Loc --      Pain Edu? --      Excl. in GC? --    No data found.  Updated Vital Signs BP 137/77 (BP Location: Left Arm)   Pulse 97   Temp 98.5 F (36.9 C) (Oral)   Resp 16   Ht 5\' 2"  (1.575 m)  Wt 135 lb (61.2 kg)   SpO2 98%   BMI 24.69 kg/m   Visual Acuity Right Eye Distance:   Left Eye Distance:   Bilateral Distance:    Right Eye Near:   Left Eye Near:    Bilateral Near:     Physical Exam Vitals and nursing note reviewed.  Constitutional:      Appearance: He is well-developed.  HENT:     Head: Normocephalic and atraumatic.     Mouth/Throat:     Mouth: Mucous membranes are moist.     Dentition: Abnormal dentition. Dental tenderness, gingival swelling and dental abscesses present.     Tongue: No lesions.     Pharynx: Oropharynx is clear. Uvula midline.      Comments: Dentition is very poor with several missing teeth.  There are anterior teeth that are decaying and broken with evidence of gingival swelling and concern for infection.  There is also evidence of a previously draining small abscess in the anterior lower mandibular dentition.  Area marked is location what appears to be small draining dental abscess.  There is also a small amount of induration anterior chin.  This  area is nontender.  No submental swelling, induration or tenderness. Eyes:     Conjunctiva/sclera: Conjunctivae normal.  Cardiovascular:     Rate and Rhythm: Normal rate.  Pulmonary:     Effort: Pulmonary effort is normal. No respiratory distress.  Abdominal:     Tenderness: There is no abdominal tenderness.  Musculoskeletal:     Cervical back: Neck supple.  Skin:    General: Skin is warm and dry.  Neurological:     Mental Status: He is alert.      UC Treatments / Results  Labs (all labs ordered are listed, but only abnormal results are displayed) Labs Reviewed - No data to display  EKG   Radiology No results found.  Procedures Procedures (including critical care time)  Medications Ordered in UC Medications - No data to display  Initial Impression / Assessment and Plan / UC Course  I have reviewed the triage vital signs and the nursing notes.  Pertinent labs & imaging results that were available during my care of the patient were reviewed by me and considered in my medical decision making (see chart for details).     #Oral infection Patient is a 59 year old with recent oral infection that did not complete his treatment.  Does seem based on history reported that he had small abscess that drained either in his lower lip or mandibular gingiva.  Given he did not complete his Bactrim and he has continued evidence of dental infection/oral infection will place him on Augmentin. Believe this is odontic in nature as opposed to abscess from a previous cellulitis.  Encouraged to continue to massage areas that may continue to drain.  Patient is to follow-up with a dentist for further evaluation.  Return precautions were discussed.  Patient verbalized understanding   Final Clinical Impressions(s) / UC Diagnoses   Final diagnoses:  Oral infection     Discharge Instructions     Take the medicine prescribed today, twice a day for 10 days.  If swelling worsens or your mouth  is worse, return for re-evaluation  Schedule to be evaluated by a Dentist as soon as possible, options are attached        ED Prescriptions    Medication Sig Dispense Auth. Provider   amoxicillin-clavulanate (AUGMENTIN) 875-125 MG tablet Take 1 tablet by mouth 2 (two)  times daily for 10 days. 20 tablet Coltan Spinello, Veryl Speak, PA-C     PDMP not reviewed this encounter.   Hermelinda Medicus, PA-C 03/27/20 1620    Elisia Stepp, Veryl Speak, PA-C 03/28/20 [phone]

## 2020-03-27 NOTE — Discharge Instructions (Addendum)
Take the medicine prescribed today, twice a day for 10 days.  If swelling worsens or your mouth is worse, return for re-evaluation  Schedule to be evaluated by a Dentist as soon as possible, options are attached

## 2020-05-26 DIAGNOSIS — Z13220 Encounter for screening for lipoid disorders: Secondary | ICD-10-CM | POA: Diagnosis not present

## 2020-05-26 DIAGNOSIS — Z0000 Encounter for general adult medical examination without abnormal findings: Secondary | ICD-10-CM | POA: Diagnosis not present

## 2020-05-26 DIAGNOSIS — Z131 Encounter for screening for diabetes mellitus: Secondary | ICD-10-CM | POA: Diagnosis not present

## 2020-06-02 DIAGNOSIS — Z23 Encounter for immunization: Secondary | ICD-10-CM | POA: Diagnosis not present

## 2020-07-20 DIAGNOSIS — K090 Developmental odontogenic cysts: Secondary | ICD-10-CM | POA: Diagnosis not present

## 2020-09-02 DIAGNOSIS — Z23 Encounter for immunization: Secondary | ICD-10-CM | POA: Diagnosis not present

## 2021-05-27 DIAGNOSIS — Z0000 Encounter for general adult medical examination without abnormal findings: Secondary | ICD-10-CM | POA: Diagnosis not present

## 2021-05-27 DIAGNOSIS — R7303 Prediabetes: Secondary | ICD-10-CM | POA: Diagnosis not present

## 2021-05-27 DIAGNOSIS — E785 Hyperlipidemia, unspecified: Secondary | ICD-10-CM | POA: Diagnosis not present

## 2021-10-14 DIAGNOSIS — M79644 Pain in right finger(s): Secondary | ICD-10-CM | POA: Diagnosis not present

## 2021-10-24 DIAGNOSIS — M65311 Trigger thumb, right thumb: Secondary | ICD-10-CM | POA: Diagnosis not present

## 2022-06-01 ENCOUNTER — Other Ambulatory Visit: Payer: Self-pay | Admitting: Physician Assistant

## 2022-06-01 DIAGNOSIS — E079 Disorder of thyroid, unspecified: Secondary | ICD-10-CM

## 2022-06-05 ENCOUNTER — Ambulatory Visit
Admission: RE | Admit: 2022-06-05 | Discharge: 2022-06-05 | Disposition: A | Discharge status: Home / Self Care | Payer: Commercial Managed Care - PPO | Source: Ambulatory Visit | Attending: Physician Assistant | Admitting: Physician Assistant

## 2022-06-05 DIAGNOSIS — E079 Disorder of thyroid, unspecified: Secondary | ICD-10-CM
